# Patient Record
Sex: Female | Born: 1960 | Race: Black or African American | Hispanic: No | Marital: Single | Smoking: Current every day smoker
Health system: Southern US, Community
[De-identification: ages and names within clinical notes are randomized; demographics above are authoritative.]

## PROBLEM LIST (undated history)

## (undated) DIAGNOSIS — F329 Major depressive disorder, single episode, unspecified: Secondary | ICD-10-CM

## (undated) DIAGNOSIS — F32A Depression, unspecified: Secondary | ICD-10-CM

## (undated) HISTORY — PX: BREAST EXCISIONAL BIOPSY: SUR124

## (undated) HISTORY — PX: BREAST BIOPSY: SHX20

---

## 2007-05-13 ENCOUNTER — Ambulatory Visit: Payer: Self-pay | Admitting: Internal Medicine

## 2010-05-30 ENCOUNTER — Ambulatory Visit: Payer: Self-pay | Admitting: Family Medicine

## 2015-04-19 ENCOUNTER — Other Ambulatory Visit: Payer: Self-pay | Admitting: Internal Medicine

## 2015-04-19 DIAGNOSIS — Z1239 Encounter for other screening for malignant neoplasm of breast: Secondary | ICD-10-CM

## 2015-05-04 ENCOUNTER — Ambulatory Visit: Payer: Self-pay

## 2015-05-31 ENCOUNTER — Ambulatory Visit: Payer: Self-pay | Attending: Internal Medicine

## 2015-10-05 ENCOUNTER — Other Ambulatory Visit: Payer: Self-pay | Admitting: Internal Medicine

## 2015-10-05 ENCOUNTER — Ambulatory Visit
Admission: RE | Admit: 2015-10-05 | Discharge: 2015-10-05 | Disposition: A | Discharge status: Home / Self Care | Payer: Commercial Managed Care - PPO | Source: Ambulatory Visit | Attending: Internal Medicine | Admitting: Internal Medicine

## 2015-10-05 DIAGNOSIS — Z1231 Encounter for screening mammogram for malignant neoplasm of breast: Secondary | ICD-10-CM | POA: Diagnosis not present

## 2015-10-05 DIAGNOSIS — Z1239 Encounter for other screening for malignant neoplasm of breast: Secondary | ICD-10-CM

## 2015-11-16 ENCOUNTER — Ambulatory Visit
Admission: RE | Admit: 2015-11-16 | Payer: Commercial Managed Care - PPO | Source: Ambulatory Visit | Admitting: Unknown Physician Specialty

## 2015-11-16 ENCOUNTER — Encounter: Admission: RE | Payer: Self-pay | Source: Ambulatory Visit

## 2015-11-16 SURGERY — COLONOSCOPY WITH PROPOFOL
Anesthesia: General

## 2016-02-15 ENCOUNTER — Ambulatory Visit: Payer: Commercial Managed Care - PPO | Admitting: Anesthesiology

## 2016-02-15 ENCOUNTER — Encounter: Payer: Self-pay | Admitting: *Deleted

## 2016-02-15 ENCOUNTER — Encounter
Admission: RE | Disposition: A | Discharge status: Home / Self Care | Payer: Self-pay | Source: Ambulatory Visit | Attending: Unknown Physician Specialty

## 2016-02-15 ENCOUNTER — Ambulatory Visit
Admission: RE | Admit: 2016-02-15 | Discharge: 2016-02-15 | Disposition: A | Discharge status: Home / Self Care | Payer: Commercial Managed Care - PPO | Source: Ambulatory Visit | Attending: Unknown Physician Specialty | Admitting: Unknown Physician Specialty

## 2016-02-15 DIAGNOSIS — Z1211 Encounter for screening for malignant neoplasm of colon: Secondary | ICD-10-CM | POA: Diagnosis not present

## 2016-02-15 DIAGNOSIS — Z79899 Other long term (current) drug therapy: Secondary | ICD-10-CM | POA: Diagnosis not present

## 2016-02-15 DIAGNOSIS — F17210 Nicotine dependence, cigarettes, uncomplicated: Secondary | ICD-10-CM | POA: Diagnosis not present

## 2016-02-15 DIAGNOSIS — K621 Rectal polyp: Secondary | ICD-10-CM | POA: Diagnosis not present

## 2016-02-15 DIAGNOSIS — Z7951 Long term (current) use of inhaled steroids: Secondary | ICD-10-CM | POA: Diagnosis not present

## 2016-02-15 DIAGNOSIS — K640 First degree hemorrhoids: Secondary | ICD-10-CM | POA: Insufficient documentation

## 2016-02-15 DIAGNOSIS — F329 Major depressive disorder, single episode, unspecified: Secondary | ICD-10-CM | POA: Insufficient documentation

## 2016-02-15 HISTORY — DX: Morbid (severe) obesity due to excess calories: E66.01

## 2016-02-15 HISTORY — PX: COLONOSCOPY WITH PROPOFOL: SHX5780

## 2016-02-15 HISTORY — DX: Major depressive disorder, single episode, unspecified: F32.9

## 2016-02-15 HISTORY — DX: Depression, unspecified: F32.A

## 2016-02-15 SURGERY — COLONOSCOPY WITH PROPOFOL
Anesthesia: General

## 2016-02-15 MED ORDER — ESMOLOL HCL 100 MG/10ML IV SOLN
INTRAVENOUS | Status: DC | PRN
  Administered 2016-02-15: 25 mg via INTRAVENOUS

## 2016-02-15 MED ORDER — SODIUM CHLORIDE 0.9 % IV SOLN
INTRAVENOUS | Status: DC
  Administered 2016-02-15: 1000 mL via INTRAVENOUS

## 2016-02-15 MED ORDER — GLYCOPYRROLATE 0.2 MG/ML IJ SOLN
INTRAMUSCULAR | Status: DC | PRN
Start: 2016-02-15 — End: 2016-02-15
  Administered 2016-02-15: 0.2 mg via INTRAVENOUS

## 2016-02-15 MED ORDER — PROPOFOL 10 MG/ML IV BOLUS
INTRAVENOUS | Status: DC | PRN
  Administered 2016-02-15: 75 mg via INTRAVENOUS
  Administered 2016-02-15: 40 mg via INTRAVENOUS
  Administered 2016-02-15 (×2): 30 mg via INTRAVENOUS
  Administered 2016-02-15: 50 mg via INTRAVENOUS
  Administered 2016-02-15: 20 mg via INTRAVENOUS
  Administered 2016-02-15: 40 mg via INTRAVENOUS
  Administered 2016-02-15: 50 mg via INTRAVENOUS
  Administered 2016-02-15: 20 mg via INTRAVENOUS

## 2016-02-15 NOTE — H&P (Signed)
   Primary Care Physician:  Glendon Axe, MD Primary Gastroenterologist:  Dr. Vira Agar  Pre-Procedure History & Physical: HPI:  Amy Carlson is a 55 y.o. female is here for an colonoscopy.   Past Medical History:  Diagnosis Date  . Depression   . Morbid obesity ([place])     Past Surgical History:  Procedure Laterality Date  . BREAST BIOPSY Left    2003 approximately    Prior to Admission medications   Medication Sig Start Date End Date Taking? Authorizing Provider  fluticasone (FLONASE) 50 MCG/ACT nasal spray Place 2 sprays into both nostrils daily.   Yes Historical Provider, MD  Multiple Vitamin (MULTIVITAMIN) tablet Take 1 tablet by mouth daily.   Yes Historical Provider, MD  escitalopram (LEXAPRO) 10 MG tablet Take 10 mg by mouth daily.    Historical Provider, MD    Allergies as of 01/09/2016  . (Not on File)    History reviewed. No pertinent family history.  Social History   Social History  . Marital status: Single    Spouse name: N/A  . Number of children: N/A  . Years of education: N/A   Occupational History  . Not on file.   Social History Main Topics  . Smoking status: Current Every Day Smoker    Packs/day: 0.50    Types: Cigarettes  . Smokeless tobacco: Never Used  . Alcohol use Yes  . Drug use: No  . Sexual activity: Not on file   Other Topics Concern  . Not on file   Social History Narrative  . No narrative on file    Review of Systems: See HPI, otherwise negative ROS  Physical Exam: BP (!) 160/90   Pulse 85   Temp (!) 96.9 F (36.1 C)   Resp 16   Ht 5\' 3"  (1.6 m)   Wt 81.6 kg (180 lb)   SpO2 100%   BMI 31.89 kg/m  General:   Alert,  pleasant and cooperative in NAD Head:  Normocephalic and atraumatic. Neck:  Supple; no masses or thyromegaly. Lungs:  Clear throughout to auscultation.    Heart:  Regular rate and rhythm. Abdomen:  Soft, nontender and nondistended. Normal bowel sounds, without guarding, and without rebound.    Neurologic:  Alert and  oriented x4;  grossly normal neurologically.  Impression/Plan: Getsemani Voros is here for an colonoscopy to be performed for screening  Risks, benefits, limitations, and alternatives regarding  colonoscopy have been reviewed with the patient.  Questions have been answered.  All parties agreeable.   Gaylyn Cheers, MD  02/15/2016, 10:47 AM

## 2016-02-15 NOTE — Anesthesia Postprocedure Evaluation (Signed)
Anesthesia Post Note  Patient: Amy Carlson  Procedure(s) Performed: Procedure(s) (LRB): COLONOSCOPY WITH PROPOFOL (N/A)  Patient location during evaluation: Endoscopy Anesthesia Type: General Level of consciousness: awake and alert Pain management: pain level controlled Vital Signs Assessment: post-procedure vital signs reviewed and stable Respiratory status: spontaneous breathing, nonlabored ventilation, respiratory function stable and patient connected to nasal cannula oxygen Cardiovascular status: blood pressure returned to baseline and stable Postop Assessment: no signs of nausea or vomiting Anesthetic complications: no    Last Vitals:  Vitals:   02/15/16 1116 02/15/16 1126  BP: (!) 127/96 138/64  Pulse: 77 75  Resp: 13 17  Temp:      Last Pain:  Vitals:   02/15/16 1046  TempSrc: Tympanic                 Martha Clan

## 2016-02-15 NOTE — Transfer of Care (Signed)
Immediate Anesthesia Transfer of Care Note  Patient: Cloie Kitchen  Procedure(s) Performed: Procedure(s): COLONOSCOPY WITH PROPOFOL (N/A)  Patient Location: PACU  Anesthesia Type:General  Level of Consciousness: awake, alert  and oriented  Airway & Oxygen Therapy: Patient Spontanous Breathing and Patient connected to nasal cannula oxygen  Post-op Assessment: Report given to RN and Post -op Vital signs reviewed and stable  Post vital signs: Reviewed and stable  Last Vitals:  Vitals:   02/15/16 0943 02/15/16 1046  BP: (!) 160/90 111/72  Pulse: 85 (!) 104  Resp: 16 15  Temp: (!) 36.1 C 36.2 C    Last Pain:  Vitals:   02/15/16 1046  TempSrc: Tympanic         Complications: No apparent anesthesia complications

## 2016-02-15 NOTE — Op Note (Signed)
[facility] Patient Name: Amy Carlson Procedure Date: 02/15/2016 10:18 AM MRN: [id number] Account #: [id number] Date of Birth: 1961/01/05 Admit Type: Outpatient Age: 55 Room: [facility] ENDO ROOM 1 Gender: Female Note Status: Finalized Procedure:            Colonoscopy Indications:          Screening for colorectal malignant neoplasm Providers:            Manya Silvas, MD Referring MD:         Glendon Axe (Referring MD) Medicines:            Propofol per Anesthesia Complications:        No immediate complications. Procedure:            Pre-Anesthesia Assessment:                       - After reviewing the risks and benefits, the patient                        was deemed in satisfactory condition to undergo the                        procedure.                       After obtaining informed consent, the colonoscope was                        passed under direct vision. Throughout the procedure,                        the patient's blood pressure, pulse, and oxygen                        saturations were monitored continuously. The                        Colonoscope was introduced through the anus and                        advanced to the the cecum, identified by appendiceal                        orifice and ileocecal valve. The colonoscopy was                        performed without difficulty. The patient tolerated the                        procedure well. The quality of the bowel preparation                        was excellent. Findings:      Three sessile polyps were found in the rectum and descending colon. The       polyps were diminutive in size. These polyps were removed with a jumbo       cold forceps. Resection and retrieval were complete.      Internal hemorrhoids were found during endoscopy. The hemorrhoids were       small and Grade I (internal hemorrhoids that do not prolapse). Impression:           -  Three diminutive  polyps in the rectum and in the                        descending colon, removed with a jumbo cold forceps.                        Resected and retrieved.                       - Internal hemorrhoids. Recommendation:       - Await pathology results. Manya Silvas, MD 02/15/2016 10:44:46 AM This report has been signed electronically. Number of Addenda: 0 Note Initiated On: 02/15/2016 10:18 AM Scope Withdrawal Time: 0 hours 12 minutes 31 seconds  Total Procedure Duration: 0 hours 17 minutes 7 seconds       [facility]

## 2016-02-15 NOTE — Anesthesia Preprocedure Evaluation (Signed)
Anesthesia Evaluation  Patient identified by MRN, date of birth, ID band Patient awake    Reviewed: Allergy & Precautions, H&P , NPO status , Patient's Chart, lab work & pertinent test results, reviewed documented beta blocker date and time   History of Anesthesia Complications Negative for: history of anesthetic complications  Airway Mallampati: III  TM Distance: >3 FB Neck ROM: full    Dental no notable dental hx. (+) Partial Upper   Pulmonary neg shortness of breath, neg sleep apnea, neg COPD, Recent URI , Resolved, Current Smoker,    Pulmonary exam normal breath sounds clear to auscultation       Cardiovascular Exercise Tolerance: Good negative cardio ROS Normal cardiovascular exam Rhythm:regular Rate:Normal     Neuro/Psych PSYCHIATRIC DISORDERS (Depression) negative neurological ROS     GI/Hepatic Neg liver ROS, GERD  ,  Endo/Other  negative endocrine ROS  Renal/GU negative Renal ROS  negative genitourinary   Musculoskeletal   Abdominal   Peds  Hematology negative hematology ROS (+)   Anesthesia Other Findings Past Medical History: No date: Depression No date: Morbid obesity ([place])   Reproductive/Obstetrics negative OB ROS                             Anesthesia Physical Anesthesia Plan  ASA: II  Anesthesia Plan: General   Post-op Pain Management:    Induction:   Airway Management Planned:   Additional Equipment:   Intra-op Plan:   Post-operative Plan:   Informed Consent: I have reviewed the patients History and Physical, chart, labs and discussed the procedure including the risks, benefits and alternatives for the proposed anesthesia with the patient or authorized representative who has indicated his/her understanding and acceptance.   Dental Advisory Given  Plan Discussed with: Anesthesiologist, CRNA and Surgeon  Anesthesia Plan Comments:          Anesthesia Quick Evaluation

## 2016-02-18 ENCOUNTER — Encounter: Payer: Self-pay | Admitting: Unknown Physician Specialty

## 2016-02-18 LAB — SURGICAL PATHOLOGY

## 2016-06-04 DIAGNOSIS — Z0000 Encounter for general adult medical examination without abnormal findings: Secondary | ICD-10-CM | POA: Diagnosis not present

## 2016-06-04 DIAGNOSIS — Z124 Encounter for screening for malignant neoplasm of cervix: Secondary | ICD-10-CM | POA: Diagnosis not present

## 2016-06-04 DIAGNOSIS — J0100 Acute maxillary sinusitis, unspecified: Secondary | ICD-10-CM | POA: Diagnosis not present

## 2016-06-04 DIAGNOSIS — Z23 Encounter for immunization: Secondary | ICD-10-CM | POA: Diagnosis not present

## 2016-06-04 DIAGNOSIS — K648 Other hemorrhoids: Secondary | ICD-10-CM | POA: Diagnosis not present

## 2016-09-02 DIAGNOSIS — J069 Acute upper respiratory infection, unspecified: Secondary | ICD-10-CM | POA: Diagnosis not present

## 2016-10-01 ENCOUNTER — Other Ambulatory Visit: Payer: Self-pay | Admitting: Internal Medicine

## 2016-10-01 DIAGNOSIS — Z1231 Encounter for screening mammogram for malignant neoplasm of breast: Secondary | ICD-10-CM

## 2016-10-22 ENCOUNTER — Ambulatory Visit
Admission: RE | Admit: 2016-10-22 | Discharge: 2016-10-22 | Disposition: A | Discharge status: Home / Self Care | Payer: Commercial Managed Care - PPO | Source: Ambulatory Visit | Attending: Internal Medicine | Admitting: Internal Medicine

## 2016-10-22 DIAGNOSIS — Z1231 Encounter for screening mammogram for malignant neoplasm of breast: Secondary | ICD-10-CM

## 2016-11-05 DIAGNOSIS — B9689 Other specified bacterial agents as the cause of diseases classified elsewhere: Secondary | ICD-10-CM | POA: Diagnosis not present

## 2016-11-05 DIAGNOSIS — J019 Acute sinusitis, unspecified: Secondary | ICD-10-CM | POA: Diagnosis not present

## 2016-11-05 DIAGNOSIS — R05 Cough: Secondary | ICD-10-CM | POA: Diagnosis not present

## 2017-01-13 ENCOUNTER — Other Ambulatory Visit: Payer: Self-pay | Admitting: Internal Medicine

## 2017-01-13 DIAGNOSIS — J0101 Acute recurrent maxillary sinusitis: Secondary | ICD-10-CM | POA: Diagnosis not present

## 2017-01-13 DIAGNOSIS — R2232 Localized swelling, mass and lump, left upper limb: Secondary | ICD-10-CM

## 2017-01-13 DIAGNOSIS — R030 Elevated blood-pressure reading, without diagnosis of hypertension: Secondary | ICD-10-CM | POA: Diagnosis not present

## 2017-01-20 ENCOUNTER — Ambulatory Visit: Payer: Commercial Managed Care - PPO

## 2017-01-30 ENCOUNTER — Ambulatory Visit
Admission: RE | Admit: 2017-01-30 | Discharge: 2017-01-30 | Disposition: A | Discharge status: Home / Self Care | Payer: Commercial Managed Care - PPO | Source: Ambulatory Visit | Attending: Internal Medicine | Admitting: Internal Medicine

## 2017-01-30 DIAGNOSIS — J0101 Acute recurrent maxillary sinusitis: Secondary | ICD-10-CM | POA: Diagnosis not present

## 2017-01-30 DIAGNOSIS — R2232 Localized swelling, mass and lump, left upper limb: Secondary | ICD-10-CM

## 2017-01-30 DIAGNOSIS — D1722 Benign lipomatous neoplasm of skin and subcutaneous tissue of left arm: Secondary | ICD-10-CM | POA: Insufficient documentation

## 2017-04-01 DIAGNOSIS — J0101 Acute recurrent maxillary sinusitis: Secondary | ICD-10-CM | POA: Diagnosis not present

## 2017-06-05 DIAGNOSIS — Z0000 Encounter for general adult medical examination without abnormal findings: Secondary | ICD-10-CM | POA: Diagnosis not present

## 2017-06-05 DIAGNOSIS — Z780 Asymptomatic menopausal state: Secondary | ICD-10-CM | POA: Diagnosis not present

## 2017-08-26 DIAGNOSIS — J209 Acute bronchitis, unspecified: Secondary | ICD-10-CM | POA: Diagnosis not present

## 2017-08-26 DIAGNOSIS — J019 Acute sinusitis, unspecified: Secondary | ICD-10-CM | POA: Diagnosis not present

## 2017-08-26 DIAGNOSIS — R6889 Other general symptoms and signs: Secondary | ICD-10-CM | POA: Diagnosis not present

## 2018-05-27 DIAGNOSIS — Z1239 Encounter for other screening for malignant neoplasm of breast: Secondary | ICD-10-CM | POA: Diagnosis not present

## 2018-05-27 DIAGNOSIS — Z780 Asymptomatic menopausal state: Secondary | ICD-10-CM | POA: Diagnosis not present

## 2018-05-27 DIAGNOSIS — R030 Elevated blood-pressure reading, without diagnosis of hypertension: Secondary | ICD-10-CM | POA: Diagnosis not present

## 2018-05-28 ENCOUNTER — Other Ambulatory Visit: Payer: Self-pay | Admitting: Internal Medicine

## 2018-05-28 DIAGNOSIS — Z1231 Encounter for screening mammogram for malignant neoplasm of breast: Secondary | ICD-10-CM

## 2018-06-25 DIAGNOSIS — I10 Essential (primary) hypertension: Secondary | ICD-10-CM | POA: Diagnosis not present

## 2018-08-04 DIAGNOSIS — I10 Essential (primary) hypertension: Secondary | ICD-10-CM | POA: Diagnosis not present

## 2020-03-13 ENCOUNTER — Other Ambulatory Visit: Payer: Self-pay | Admitting: Internal Medicine

## 2020-03-13 DIAGNOSIS — Z1231 Encounter for screening mammogram for malignant neoplasm of breast: Secondary | ICD-10-CM

## 2020-04-11 ENCOUNTER — Ambulatory Visit
Admission: RE | Admit: 2020-04-11 | Discharge: 2020-04-11 | Disposition: A | Discharge status: Home / Self Care | Payer: Commercial Managed Care - PPO | Source: Ambulatory Visit | Attending: Internal Medicine | Admitting: Internal Medicine

## 2020-04-11 ENCOUNTER — Other Ambulatory Visit: Payer: Self-pay

## 2020-04-11 DIAGNOSIS — Z1231 Encounter for screening mammogram for malignant neoplasm of breast: Secondary | ICD-10-CM | POA: Diagnosis present

## 2021-01-23 IMAGING — MG DIGITAL SCREENING BILAT W/ TOMO W/ CAD
8 series · 8 of 24 positions shown · non-contrast
Comparison: Previous exam(s).

CLINICAL DATA: Screening.

EXAM:
DIGITAL SCREENING BILATERAL MAMMOGRAM WITH TOMO AND CAD

[R CC synth-2D]
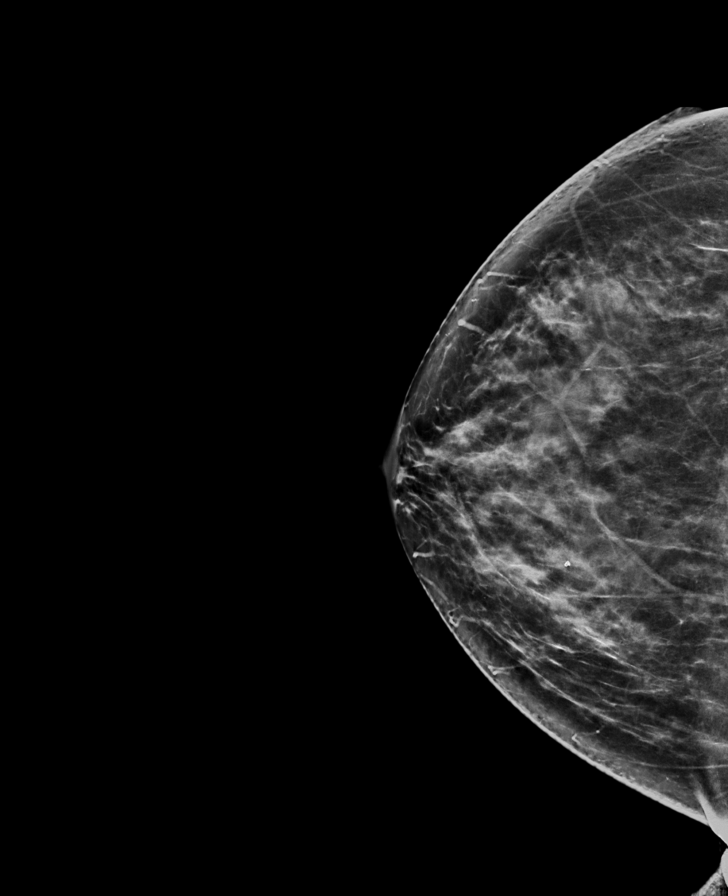

[R MLO synth-2D]
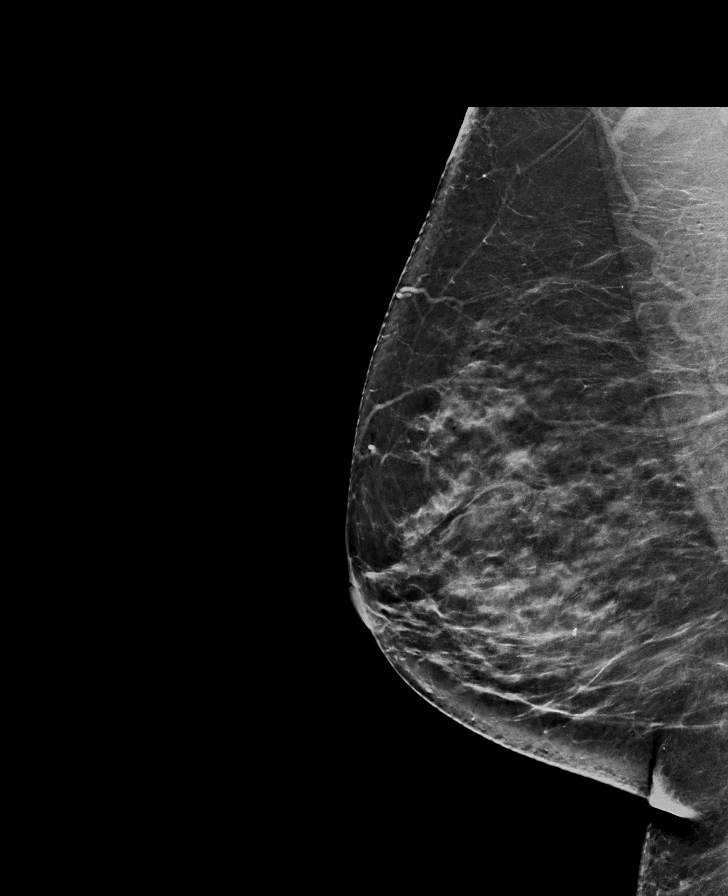

[L CC synth-2D]
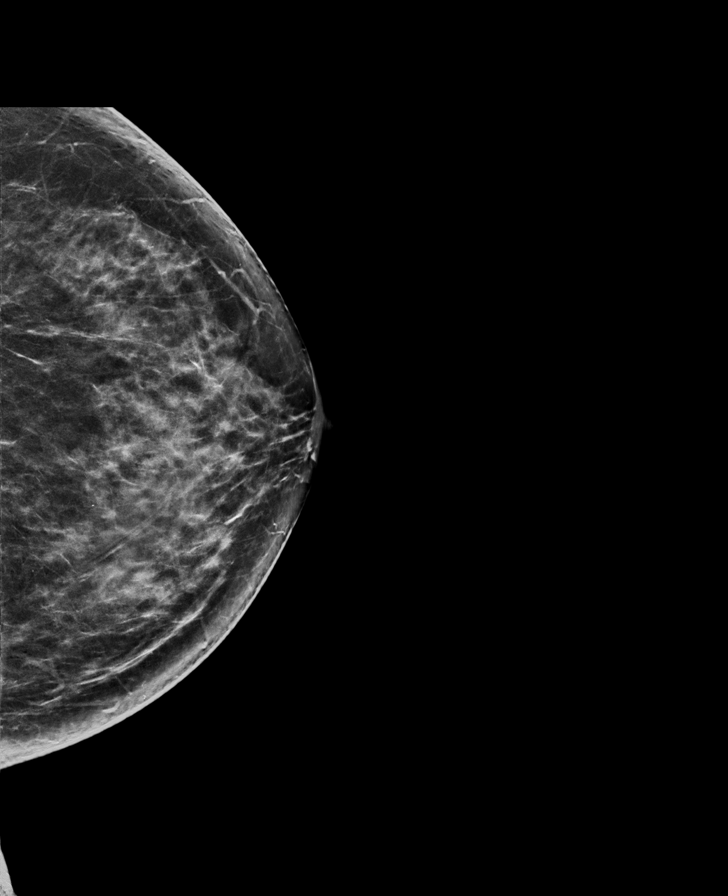

[L MLO synth-2D]
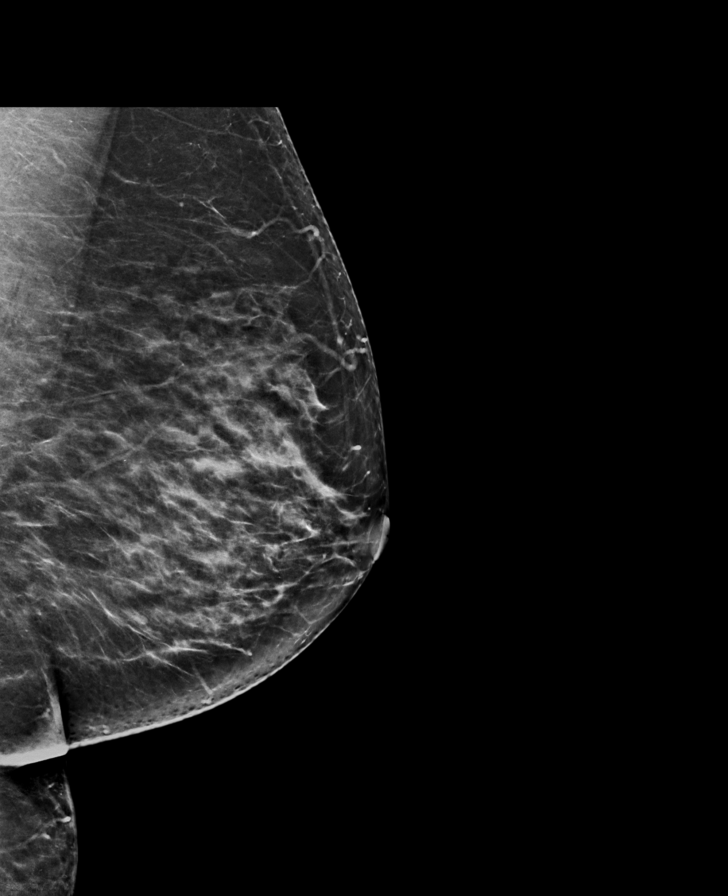

[L MLO tomo · tomo slice 37/74.0]
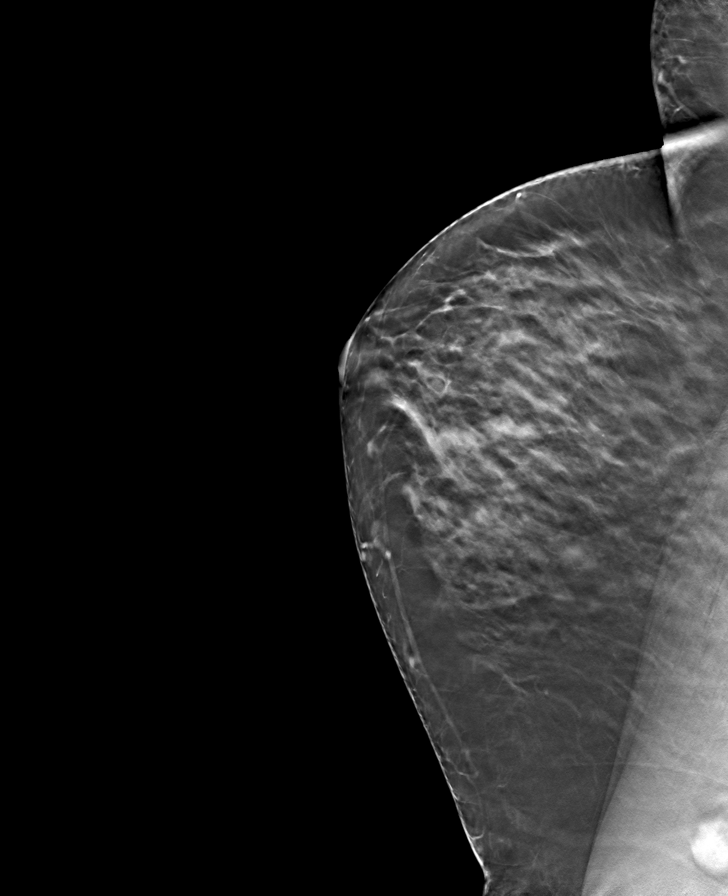

[L CC tomo · tomo slice 37/72.0]
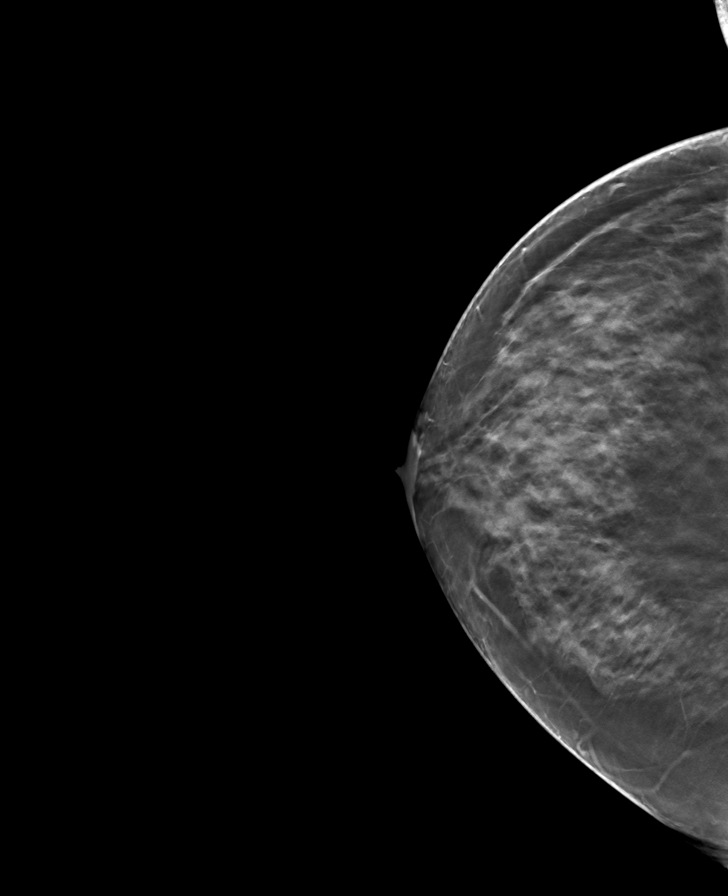

[R MLO tomo · tomo slice 41/81.0]
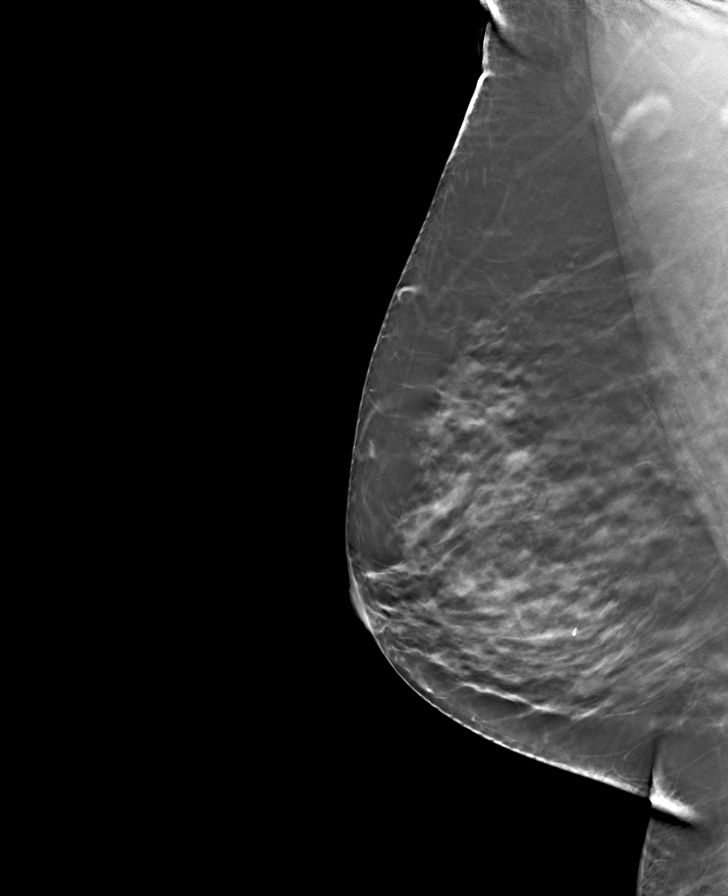

[R CC tomo · tomo slice 38/75.0]
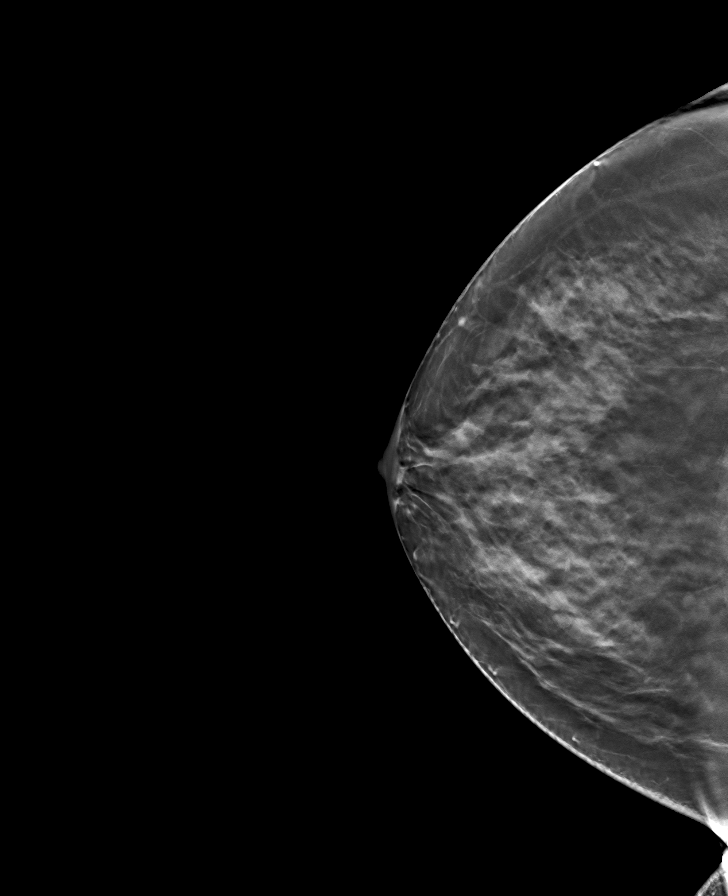

[8 of 24 positions shown; findings below may reference images not displayed]

ACR Breast Density Category c: The breast tissue is heterogeneously
dense, which may obscure small masses.
FINDINGS: There are no findings suspicious for malignancy. Images were
processed with CAD.
IMPRESSION: No mammographic evidence of malignancy. A result letter of this
screening mammogram will be mailed directly to the patient.

RECOMMENDATION:
Screening mammogram in one year. (Code:FT-U-LHB)

BI-RADS CATEGORY  1: Negative.

## 2022-04-14 ENCOUNTER — Other Ambulatory Visit: Payer: Self-pay | Admitting: Internal Medicine

## 2022-04-14 DIAGNOSIS — Z1231 Encounter for screening mammogram for malignant neoplasm of breast: Secondary | ICD-10-CM

## 2022-07-03 ENCOUNTER — Inpatient Hospital Stay: Admission: RE | Admit: 2022-07-03 | Payer: Commercial Managed Care - PPO | Source: Ambulatory Visit

## 2022-09-23 ENCOUNTER — Ambulatory Visit
Admission: RE | Admit: 2022-09-23 | Discharge: 2022-09-23 | Disposition: A | Discharge status: Home / Self Care | Payer: 59 | Source: Ambulatory Visit | Attending: Internal Medicine | Admitting: Internal Medicine

## 2022-09-23 DIAGNOSIS — Z1231 Encounter for screening mammogram for malignant neoplasm of breast: Secondary | ICD-10-CM | POA: Diagnosis not present

## 2023-12-10 ENCOUNTER — Ambulatory Visit (INDEPENDENT_AMBULATORY_CARE_PROVIDER_SITE_OTHER): Payer: Self-pay

## 2023-12-10 DIAGNOSIS — K621 Rectal polyp: Secondary | ICD-10-CM | POA: Diagnosis not present

## 2023-12-10 DIAGNOSIS — Z09 Encounter for follow-up examination after completed treatment for conditions other than malignant neoplasm: Secondary | ICD-10-CM | POA: Diagnosis present

## 2023-12-10 DIAGNOSIS — K640 First degree hemorrhoids: Secondary | ICD-10-CM | POA: Diagnosis not present

## 2023-12-10 DIAGNOSIS — Z860102 Personal history of hyperplastic colon polyps: Secondary | ICD-10-CM | POA: Diagnosis not present
